# Patient Record
Sex: Male | Born: 1945 | Race: Black or African American | Hispanic: No | Marital: Married | Smoking: Former smoker
Health system: Southern US, Community
[De-identification: ages and names within clinical notes are randomized; demographics above are authoritative.]

## PROBLEM LIST (undated history)

## (undated) DIAGNOSIS — E785 Hyperlipidemia, unspecified: Secondary | ICD-10-CM

## (undated) DIAGNOSIS — I10 Essential (primary) hypertension: Secondary | ICD-10-CM

## (undated) HISTORY — DX: Essential (primary) hypertension: I10

## (undated) HISTORY — DX: Hyperlipidemia, unspecified: E78.5

---

## 1997-06-17 ENCOUNTER — Ambulatory Visit (HOSPITAL_COMMUNITY): Admission: RE | Admit: 1997-06-17 | Discharge: 1997-06-17 | Payer: Self-pay | Admitting: Internal Medicine

## 1997-10-26 ENCOUNTER — Ambulatory Visit (HOSPITAL_COMMUNITY): Admission: RE | Admit: 1997-10-26 | Discharge: 1997-10-26 | Payer: Self-pay | Admitting: Internal Medicine

## 1998-02-17 ENCOUNTER — Ambulatory Visit (HOSPITAL_COMMUNITY): Admission: RE | Admit: 1998-02-17 | Discharge: 1998-02-17 | Payer: Self-pay | Admitting: Internal Medicine

## 1999-11-01 ENCOUNTER — Encounter: Payer: Self-pay | Admitting: Internal Medicine

## 1999-11-01 ENCOUNTER — Ambulatory Visit (HOSPITAL_COMMUNITY): Admission: RE | Admit: 1999-11-01 | Discharge: 1999-11-01 | Payer: Self-pay | Admitting: Internal Medicine

## 2003-07-10 ENCOUNTER — Observation Stay (HOSPITAL_COMMUNITY): Admission: RE | Admit: 2003-07-10 | Discharge: 2003-07-11 | Payer: Self-pay | Admitting: Urology

## 2006-11-09 ENCOUNTER — Emergency Department (HOSPITAL_COMMUNITY): Admission: EM | Admit: 2006-11-09 | Discharge: 2006-11-09 | Payer: Self-pay | Admitting: Emergency Medicine

## 2007-10-12 ENCOUNTER — Encounter: Payer: Self-pay | Admitting: Internal Medicine

## 2007-10-12 ENCOUNTER — Ambulatory Visit: Payer: Self-pay | Admitting: Vascular Surgery

## 2007-10-12 ENCOUNTER — Ambulatory Visit (HOSPITAL_COMMUNITY): Admission: RE | Admit: 2007-10-12 | Discharge: 2007-10-12 | Payer: Self-pay | Admitting: Internal Medicine

## 2010-05-07 ENCOUNTER — Encounter: Payer: Self-pay | Admitting: Internal Medicine

## 2010-05-31 NOTE — Discharge Summary (Signed)
NAME:  Aaron Wells, BEER NO.:  [id number]   MEDICAL RECORD NO.:  [id number]                   PATIENT TYPE:  OBV   LOCATION:  0345                                 FACILITY:  [facility]   PHYSICIAN:  Lindaann Slough, M.D.               DATE OF BIRTH:  02/13/1944   DATE OF ADMISSION:  07/10/2003  DATE OF DISCHARGE:  07/11/2003                                 DISCHARGE SUMMARY   PROCEDURE DONE:  Removal and insertion of penile prosthesis on July 10, 2003.   HISTORY:  The patient is a 65 year old male who had a Dura 2 prosthesis  inserted in June 1995.  About a month ago, he noticed that the right  prosthesis was broken.  He was found, on physical examination, to have a  malfunction of the prosthesis, and he had a removal and replacement of the  prosthesis done on July 10, 2003, and he was admitted for observation after  the procedure.   PHYSICAL EXAMINATION:  VITAL SIGNS:  His blood pressure was 134/71, pulse  50, respirations 14, temperature 97.5.  HEENT:  Head is normal.  Pupils equal, round and reactive to light and  accommodation.  Ears, nose, and throat are within normal limits.  LUNGS:  Clear.  HEART:  Regular rhythm.  ABDOMEN:  Soft, nondistended, nontender.  No CVA tenderness.  No inguinal  hernia.  Bladder was not distended.  GU:  Penis and scrotal contents are within normal limits.  RECTAL:  Sphincter tone is normal.  Prostate is enlarged at 30 gm.  No  nodules.   LABORATORY DATA:  Hemoglobin on admission was 12.5 with hematocrit of 37.1  and WBC 3.4.  Sodium was 141, potassium 4.4, glucose 185, BUN 10, creatinine  1.3.  Urine is sterile.  Chest x-ray showed no evidence of active disease.  His EKG is normal.   Postoperatively, he did well.  He did not have any pain.  Foley catheter was  removed on the evening of the surgery, and after removing the Foley, he was  voiding well, his urine was clear.  The wound is clean and dry.  He was then  discharged home on July 11, 2003.   DISCHARGE MEDICATIONS:  1. Glucophage 1000 mg twice a day.  2. Actos 30 mg daily.  3. Percocet 5/325, 1-2 tablets q.4h. p.r.n. for pain.  4. Keflex 500 mg q.6h.  5. Lumigan one drop both eyes at bedtime.  6. Cosopt one drop both eyes twice a day.   DISCHARGE INSTRUCTIONS:  He was instructed not to do any lifting or any  straining or driving until further advised.   CONDITION ON DISCHARGE:  Improved.   DISCHARGE DIET:  Regular.   FOLLOW UP:  The patient will be followed in my office in 1 week.  Lindaann Slough, M.D.    MN/MEDQ  D:  07/11/2003  T:  07/11/2003  Job:  [id number]

## 2010-05-31 NOTE — Op Note (Signed)
NAME:  Aaron Wells, CASHER                          ACCOUNT NO.:  [id number]   MEDICAL RECORD NO.:  [id number]                   PATIENT TYPE:  AMB   LOCATION:  DAY                                  FACILITY:  [facility]   PHYSICIAN:  Lindaann Slough, M.D.               DATE OF BIRTH:  02/13/1944   DATE OF PROCEDURE:  07/10/2003  DATE OF DISCHARGE:                                 OPERATIVE REPORT   PREOPERATIVE DIAGNOSIS:  Malfunction of Dura-II prosthesis.   POSTOPERATIVE DIAGNOSIS:  Malfunction of Dura-II prosthesis.   PROCEDURE:  Removal of Dura-II prosthesis and replacement of Dura-II penile  prosthesis.   SURGEON:  Lindaann Slough, M.D.   ANESTHESIA:  General.   INDICATIONS:  Patient is a 65 year old male who had a Dura-II penile  prosthesis inserted in June, 1995.  He was doing well until about a month  ago when he noticed that the distal end of the prosthesis broke.  He was  seen in the office and was found to have a malfunction of the prosthesis.  In addition, he wanted to have the same prosthesis, and he is scheduled  today for the procedure.   Under general anesthesia, the patient was prepped and draped and placed in  the supine position.  The penis was scrubbed for about 10 minutes.  A  circumcision incision was made on the foreskin, and the incision was carried  down to the subcutaneous tissues.  The corpora were then identified, and a  corporectomy was done on the left side.  Two stay sutures were placed on  each side of the incision, then the prosthesis was removed.  The same  procedure was done on the right side, and the prosthesis was found to be  broken on the right side.  The distance between the pubis and mid glands was  13 cm.  The total length of the corpus was 21 cm.  The corpora were then  dilated without difficulty up to #14.  The corpora were then irrigated with  bulb juice.  Then a 5 cm distal tip extender and a 3 cm proximal tip  extender was placed on the  prosthesis, and then the prosthesis was inserted  into the left corpus was difficulty.  The same procedure was done on the  right side.  The wound was then irrigated with bulb juice, then the  corporotomies were closed with #2-0 Vicryl.  Then the penile incision was  closed in two layers with 4-0 chromic.  A #16 Foley catheter was inserted in  the bladder.  The urine was clear.   The patient tolerated the procedure well and was sent in satisfactory  condition to the post anesthesia care unit.  Lindaann Slough, M.D.    MN/MEDQ  D:  07/10/2003  T:  07/10/2003  Job:  [id number]

## 2010-07-02 ENCOUNTER — Other Ambulatory Visit: Payer: Self-pay | Admitting: Internal Medicine

## 2010-07-03 ENCOUNTER — Ambulatory Visit
Admission: RE | Admit: 2010-07-03 | Discharge: 2010-07-03 | Disposition: A | Discharge status: Home / Self Care | Payer: Medicare Other | Source: Ambulatory Visit | Attending: Internal Medicine | Admitting: Internal Medicine

## 2012-12-14 ENCOUNTER — Encounter (INDEPENDENT_AMBULATORY_CARE_PROVIDER_SITE_OTHER): Payer: Self-pay

## 2012-12-14 ENCOUNTER — Ambulatory Visit (HOSPITAL_COMMUNITY)
Admission: RE | Admit: 2012-12-14 | Discharge: 2012-12-14 | Disposition: A | Discharge status: Home / Self Care | Payer: Medicare Other | Source: Ambulatory Visit | Attending: Vascular Surgery | Admitting: Vascular Surgery

## 2012-12-14 ENCOUNTER — Other Ambulatory Visit (HOSPITAL_COMMUNITY): Payer: Self-pay | Admitting: Internal Medicine

## 2012-12-14 DIAGNOSIS — R252 Cramp and spasm: Secondary | ICD-10-CM

## 2013-01-14 ENCOUNTER — Other Ambulatory Visit: Payer: Self-pay | Admitting: Vascular Surgery

## 2013-01-14 DIAGNOSIS — I739 Peripheral vascular disease, unspecified: Secondary | ICD-10-CM

## 2013-01-24 ENCOUNTER — Encounter: Payer: Self-pay | Admitting: Vascular Surgery

## 2013-01-25 ENCOUNTER — Encounter (INDEPENDENT_AMBULATORY_CARE_PROVIDER_SITE_OTHER): Payer: Self-pay

## 2013-01-25 ENCOUNTER — Ambulatory Visit (INDEPENDENT_AMBULATORY_CARE_PROVIDER_SITE_OTHER): Payer: Medicare Other | Admitting: Vascular Surgery

## 2013-01-25 ENCOUNTER — Encounter: Payer: Self-pay | Admitting: Vascular Surgery

## 2013-01-25 ENCOUNTER — Ambulatory Visit (HOSPITAL_COMMUNITY)
Admission: RE | Admit: 2013-01-25 | Discharge: 2013-01-25 | Disposition: A | Discharge status: Home / Self Care | Payer: Medicare Other | Source: Ambulatory Visit | Attending: Vascular Surgery | Admitting: Vascular Surgery

## 2013-01-25 VITALS — BP 200/90 | HR 65 | Ht 73.0 in | Wt 185.0 lb

## 2013-01-25 DIAGNOSIS — I739 Peripheral vascular disease, unspecified: Secondary | ICD-10-CM | POA: Insufficient documentation

## 2013-01-25 DIAGNOSIS — I70219 Atherosclerosis of native arteries of extremities with intermittent claudication, unspecified extremity: Secondary | ICD-10-CM

## 2013-01-25 NOTE — Progress Notes (Signed)
Patient name: Aaron Wells MRN: [id number] DOB: Feb 26, 1945 Sex: male   Referred by: Dr. Marlou Sa  Reason for referral:  Chief Complaint  Patient presents with  . PVD    new pt, PVD    HISTORY OF PRESENT ILLNESS: Patient is a very nice 68 year old gentleman who presents with straightforward right calf claudication. He reports that his younger age she was an avid walker walking 7 miles a day. He stopped this and when he returned to walking noted tiredness and aching sensation in his right calf. He does work part-time and reports of this vessel difficulty with him when he is working. He was concern that he may be causing harm no tissue loss history. He has no cardiac or history of stroke. He does not have any left leg symptoms. Does have a history of hypertension hyperlipidemia and diabetes. Quit smoking in 1975  Past Medical History  Diagnosis Date  . Diabetes mellitus   . Hyperlipidemia   . Hypertension     History reviewed. No pertinent past surgical history.  History   Social History  . Marital Status: Married    Spouse Name: N/A    Number of Children: N/A  . Years of Education: N/A   Occupational History  . Not on file.   Social History Main Topics  . Smoking status: Former Smoker    Types: Cigarettes    Quit date: 01/25/1973  . Smokeless tobacco: Never Used  . Alcohol Use: No  . Drug Use: No  . Sexual Activity: Not on file   Other Topics Concern  . Not on file   Social History Narrative  . No narrative on file    Family History  Problem Relation Age of Onset  . Diabetes Mother   . Heart disease Mother   . Hyperlipidemia Mother   . Heart attack Mother   . Diabetes Father   . Heart disease Father   . Hyperlipidemia Father   . Heart attack Father   . Peripheral vascular disease Father     amputation  . Diabetes Sister   . Heart disease Sister   . Hyperlipidemia Sister   . Heart attack Sister   . Diabetes Brother     Allergies as of 01/25/2013    . (No Known Allergies)    Current Outpatient Prescriptions on File Prior to Visit  Medication Sig Dispense Refill  . CALCIUM CITRATE PO Take by mouth daily.        Marland Kitchen GLUCOSAMINE PO Take by mouth daily.        . Multiple Vitamins-Minerals (CENTRUM SILVER PO) Take by mouth daily.        . pioglitazone-metformin (ACTOPLUS MET) 15-850 MG per tablet Take 1 tablet by mouth 2 (two) times daily.        . Saxagliptin-Metformin (KOMBIGLYZE XR PO) Take 500 mg by mouth daily.         No current facility-administered medications on file prior to visit.     REVIEW OF SYSTEMS:  Positives indicated with an "X"  CARDIOVASCULAR:  [ ]  chest pain   [ ]  chest pressure   [ ]  palpitations   [ ]  orthopnea   [ ]  dyspnea on exertion   [x ] claudication   [ ]  rest pain   [ ]  DVT   [ ]  phlebitis PULMONARY:   [ ]  productive cough   [ ]  asthma   [ ]  wheezing NEUROLOGIC:   [ ]  weakness  [ ]   paresthesias  [ ]  aphasia  [ ]  amaurosis  [ ]  dizziness HEMATOLOGIC:   [ ]  bleeding problems   [ ]  clotting disorders MUSCULOSKELETAL:  [ ]  joint pain   [ ]  joint swelling GASTROINTESTINAL: [ ]   blood in stool  [ ]   hematemesis GENITOURINARY:  [ ]   dysuria  [ ]   hematuria PSYCHIATRIC:  [ ]  history of major depression INTEGUMENTARY:  [x ] rashes  [ ]  ulcers CONSTITUTIONAL:  [ ]  fever   [ ]  chills  PHYSICAL EXAMINATION:  General: The patient is a well-nourished male, in no acute distress. Vital signs are BP 200/90  Pulse 65  Ht 6' 1"  (1.854 m)  Wt 185 lb (83.915 kg)  BMI 24.41 kg/m2  SpO2 100% Pulmonary: There is a good air exchange bilaterally without wheezing or rales. Abdomen: Soft and non-tender with normal pitch bowel sounds. No aneurysm palpable Musculoskeletal: There are no major deformities.  There is no significant extremity pain. Neurologic: No focal weakness or paresthesias are detected, Skin: There are no ulcer or rashes noted. Psychiatric: The patient has normal affect. Cardiovascular: There is a  regular rate and rhythm without significant murmur appreciated. Carotid arteries without bruits bilaterally Pulse status 2+ radial 2+ femoral 2+ popliteal pulses bilaterally. Absent right dorsalis pedis and posterior tibial pulse, 2+ left posterior tibial pulse  [facility] Vascular Lab Studies:  Ordered and Independently Reviewed revealed an ankle arm index of 0.63 on the right and normal on the left.  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 Had a long discussion with the patient explaining that this is not limb threatening. Also explained that walking program was important for him to continue his walking ability. I did explain her next evaluation would be arteriography. I explained that the popliteal location be unlikely that he would have any lesion that would be treated with endovascular means with angioplasty or stenting. Did explain procedure of bypass around the obstruction with saphenous vein bypass. I explained we would reserve this for severely limiting claudication or rest pain. Ex-preemie at unlikely that would progress to this. Did discuss the potential use of Pletal but explained that this had improvement in the small percentage of patients and he does not want to be placed on additional medication. He will continue to monitor this notify should he develop progressive symptoms. Otherwise he will continue his walking program and see [place] on an as-needed basis    [name] [facility] [place] Office: [phone]

## 2014-07-10 ENCOUNTER — Other Ambulatory Visit: Payer: Self-pay

## 2014-09-12 ENCOUNTER — Other Ambulatory Visit: Payer: Self-pay | Admitting: Internal Medicine

## 2014-09-12 ENCOUNTER — Ambulatory Visit
Admission: RE | Admit: 2014-09-12 | Discharge: 2014-09-12 | Disposition: A | Discharge status: Home / Self Care | Payer: PRIVATE HEALTH INSURANCE | Source: Ambulatory Visit | Attending: Internal Medicine | Admitting: Internal Medicine

## 2014-09-12 DIAGNOSIS — T65221A Toxic effect of tobacco cigarettes, accidental (unintentional), initial encounter: Secondary | ICD-10-CM

## 2015-08-07 ENCOUNTER — Encounter (HOSPITAL_COMMUNITY): Payer: PRIVATE HEALTH INSURANCE

## 2015-08-20 ENCOUNTER — Other Ambulatory Visit (HOSPITAL_COMMUNITY): Payer: Self-pay | Admitting: Internal Medicine

## 2015-08-20 ENCOUNTER — Ambulatory Visit (HOSPITAL_COMMUNITY)
Admission: RE | Admit: 2015-08-20 | Discharge: 2015-08-20 | Disposition: A | Discharge status: Home / Self Care | Payer: Medicare Other | Source: Ambulatory Visit | Attending: Internal Medicine | Admitting: Internal Medicine

## 2015-08-20 DIAGNOSIS — E119 Type 2 diabetes mellitus without complications: Secondary | ICD-10-CM | POA: Diagnosis not present

## 2015-08-20 DIAGNOSIS — I739 Peripheral vascular disease, unspecified: Secondary | ICD-10-CM

## 2015-08-20 DIAGNOSIS — I10 Essential (primary) hypertension: Secondary | ICD-10-CM | POA: Diagnosis not present

## 2015-08-20 DIAGNOSIS — R938 Abnormal findings on diagnostic imaging of other specified body structures: Secondary | ICD-10-CM | POA: Diagnosis not present

## 2015-08-20 DIAGNOSIS — E785 Hyperlipidemia, unspecified: Secondary | ICD-10-CM | POA: Insufficient documentation

## 2018-07-05 MED FILL — SPS 15 GM/60 ML SUSPENSION: 15 | 1 days supply | Qty: 200 | Fill #0

## 2018-07-22 ENCOUNTER — Other Ambulatory Visit: Payer: Self-pay | Admitting: Nephrology

## 2018-07-22 DIAGNOSIS — N183 Chronic kidney disease, stage 3 unspecified: Secondary | ICD-10-CM

## 2018-07-27 ENCOUNTER — Other Ambulatory Visit: Payer: Self-pay

## 2018-07-27 ENCOUNTER — Ambulatory Visit
Admission: RE | Admit: 2018-07-27 | Discharge: 2018-07-27 | Disposition: A | Discharge status: Home / Self Care | Payer: Medicare Other | Source: Ambulatory Visit | Attending: Nephrology | Admitting: Nephrology

## 2018-07-27 DIAGNOSIS — N183 Chronic kidney disease, stage 3 unspecified: Secondary | ICD-10-CM

## 2018-11-19 ENCOUNTER — Other Ambulatory Visit: Payer: Self-pay | Admitting: Orthopedic Surgery

## 2018-11-19 DIAGNOSIS — M7502 Adhesive capsulitis of left shoulder: Secondary | ICD-10-CM

## 2018-11-19 DIAGNOSIS — M75102 Unspecified rotator cuff tear or rupture of left shoulder, not specified as traumatic: Secondary | ICD-10-CM

## 2018-12-01 ENCOUNTER — Other Ambulatory Visit: Payer: Self-pay

## 2018-12-01 ENCOUNTER — Ambulatory Visit
Admission: RE | Admit: 2018-12-01 | Discharge: 2018-12-01 | Disposition: A | Discharge status: Home / Self Care | Payer: Medicare Other | Source: Ambulatory Visit | Attending: Orthopedic Surgery | Admitting: Orthopedic Surgery

## 2018-12-01 DIAGNOSIS — M7502 Adhesive capsulitis of left shoulder: Secondary | ICD-10-CM

## 2018-12-01 DIAGNOSIS — M75102 Unspecified rotator cuff tear or rupture of left shoulder, not specified as traumatic: Secondary | ICD-10-CM

## 2018-12-01 MED ORDER — IOPAMIDOL (ISOVUE-M 200) INJECTION 41%
12.0000 mL | Freq: Once | INTRAMUSCULAR | Status: AC
  Administered 2018-12-01: 17:00:00 12 mL via INTRA_ARTICULAR

## 2019-03-06 ENCOUNTER — Ambulatory Visit: Payer: Self-pay | Attending: Internal Medicine

## 2019-03-06 DIAGNOSIS — Z23 Encounter for immunization: Secondary | ICD-10-CM | POA: Insufficient documentation

## 2019-03-06 NOTE — Progress Notes (Signed)
   Covid-19 Vaccination Clinic  Name:  Vishnu Moeller    MRN: [id number] DOB: [birth year]  03/06/2019  Mr. Berka was observed post Covid-19 immunization for 15 minutes without incidence. He was provided with Vaccine Information Sheet and instruction to access the V-Safe system.   Mr. Gappa was instructed to call 911 with any severe reactions post vaccine: Marland Kitchen Difficulty breathing  . Swelling of your face and throat  . A fast heartbeat  . A bad rash all over your body  . Dizziness and weakness    Immunizations Administered    Name Date Dose VIS Date Route   Pfizer COVID-19 Vaccine 03/06/2019  9:42 AM 0.3 mL 12/24/2018 Intramuscular   Manufacturer: [employer], [employer]   Lot: [id number]   NDC: [address]-2

## 2019-03-30 ENCOUNTER — Ambulatory Visit: Payer: Medicare PPO | Attending: Internal Medicine

## 2019-03-30 DIAGNOSIS — Z23 Encounter for immunization: Secondary | ICD-10-CM

## 2019-03-30 NOTE — Progress Notes (Signed)
   Covid-19 Vaccination Clinic  Name:  Aaron Wells    MRN: [id number] DOB: 12/07/1945  03/30/2019  [name] was observed post Covid-19 immunization for 15 minutes without incident. He was provided with Vaccine Information Sheet and instruction to access the V-Safe system.   [name] was instructed to call 911 with any severe reactions post vaccine: Marland Kitchen Difficulty breathing  . Swelling of face and throat  . A fast heartbeat  . A bad rash all over body  . Dizziness and weakness   Immunizations Administered    Name Date Dose VIS Date Route   Pfizer COVID-19 Vaccine 03/30/2019  9:23 AM 0.3 mL 12/24/2018 Intramuscular   Manufacturer: [employer], [employer]   Lot: [id number]   NDC: [address]-2

## 2020-04-29 IMAGING — XA DG FLUORO GUIDE NDL PLC/BX
1 series · 1 of 1 positions shown · non-contrast
Comparison: none

CLINICAL DATA: Left shoulder pain.

[Series 1: ortho standard · 1 of 1 slices shown]
[im 1/1]
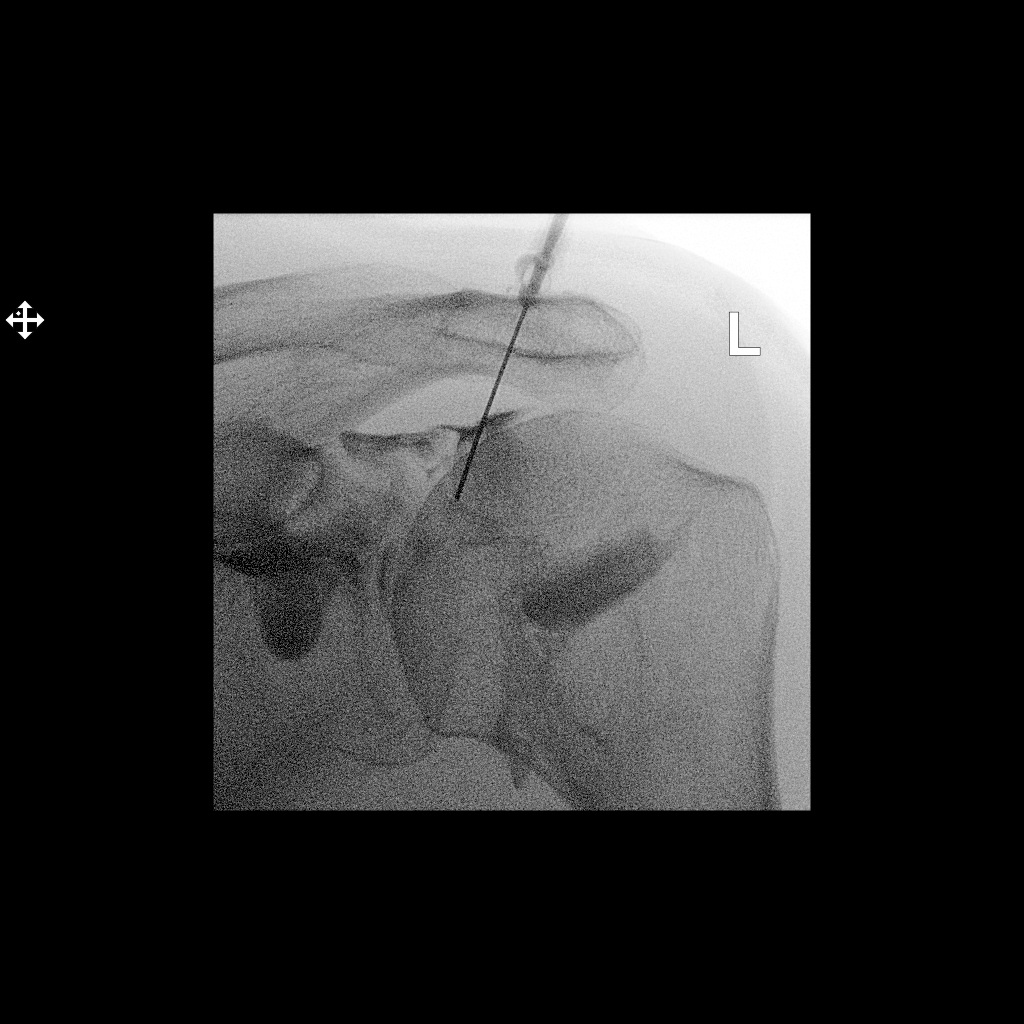

[1 of 1 positions shown; findings below may reference images not displayed]

FLUOROSCOPY TIME:  Radiation Exposure Index (as provided by the
fluoroscopic device): 0.1 mGy

Fluoroscopy Time:  Less than 1 second

Number of Acquired Images:  0

PROCEDURE:
The risks and benefits of the procedure were discussed with the
patient, and written informed consent was obtained. The patient
stated no history of allergy to contrast media. A formal timeout
procedure was performed with the patient according to departmental
protocol.

The patient was placed supine on the fluoroscopy table and the left
glenohumeral joint was identified under fluoroscopy. The skin
overlying the left glenohumeral joint was subsequently cleaned with
Betadine and a sterile drape was placed over the area of interest. 2
ml 1% Lidocaine was used to anesthetize the skin around the needle
insertion site.

A 22 gauge spinal needle was inserted into the left glenohumeral
joint under fluoroscopy.

12 ml of contrast mixture (5 ml of sterile saline and 15 ml of
Isovue-M 200 contrast) were injected into the left glenohumeral
joint.

The needle was removed and hemostasis was achieved. The patient was
subsequently transferred to CT for imaging.
IMPRESSION: Technically successful left shoulder injection for CT.

## 2022-01-01 ENCOUNTER — Ambulatory Visit (HOSPITAL_COMMUNITY)
Admission: RE | Admit: 2022-01-01 | Discharge: 2022-01-01 | Disposition: A | Discharge status: Home / Self Care | Payer: Medicare PPO | Source: Ambulatory Visit | Attending: Vascular Surgery | Admitting: Vascular Surgery

## 2022-01-01 ENCOUNTER — Other Ambulatory Visit (HOSPITAL_COMMUNITY): Payer: Self-pay | Admitting: Internal Medicine

## 2022-01-01 ENCOUNTER — Ambulatory Visit (INDEPENDENT_AMBULATORY_CARE_PROVIDER_SITE_OTHER)
Admission: RE | Admit: 2022-01-01 | Discharge: 2022-01-01 | Disposition: A | Discharge status: Home / Self Care | Payer: Medicare PPO | Source: Ambulatory Visit | Attending: Vascular Surgery | Admitting: Vascular Surgery

## 2022-01-01 DIAGNOSIS — I70211 Atherosclerosis of native arteries of extremities with intermittent claudication, right leg: Secondary | ICD-10-CM

## 2022-01-01 DIAGNOSIS — I70219 Atherosclerosis of native arteries of extremities with intermittent claudication, unspecified extremity: Secondary | ICD-10-CM

## 2022-05-15 ENCOUNTER — Other Ambulatory Visit: Payer: Self-pay | Admitting: *Deleted

## 2022-05-15 DIAGNOSIS — M79606 Pain in leg, unspecified: Secondary | ICD-10-CM

## 2022-05-16 ENCOUNTER — Ambulatory Visit (HOSPITAL_COMMUNITY)
Admission: RE | Admit: 2022-05-16 | Discharge: 2022-05-16 | Disposition: A | Discharge status: Home / Self Care | Payer: Medicare PPO | Source: Ambulatory Visit | Attending: Vascular Surgery | Admitting: Vascular Surgery

## 2022-05-16 DIAGNOSIS — M79606 Pain in leg, unspecified: Secondary | ICD-10-CM | POA: Diagnosis present

## 2022-05-16 LAB — VAS US ABI WITH/WO TBI
Left ABI: 0.87
Right ABI: 0.53

## 2022-05-23 ENCOUNTER — Ambulatory Visit: Payer: Medicare PPO | Admitting: Vascular Surgery

## 2022-05-23 ENCOUNTER — Encounter: Payer: Self-pay | Admitting: Vascular Surgery

## 2022-05-23 VITALS — BP 145/85 | HR 79 | Temp 98.0°F | Resp 20 | Ht 73.0 in | Wt 159.0 lb

## 2022-05-23 DIAGNOSIS — I70213 Atherosclerosis of native arteries of extremities with intermittent claudication, bilateral legs: Secondary | ICD-10-CM

## 2022-05-23 NOTE — Progress Notes (Signed)
Office Note     CC: Bilateral lower extremity claudication Requesting Provider:  Gwenyth Bender, MD  HPI: Coehn Banner is a 77 y.o. (Feb 22, 1945) male presenting at the request of .Gwenyth Bender, MD for evaluation of bilateral lower extremity claudication.  On exam, Purvis was doing well.  A native of [place], he is a [employer] high school.  He worked for years for the state, and is now retired, Retail banker pension.  He also worked part-time at [employer].  He has multiple children and grandchildren, whom he is very proud of.  His spouse could not attend his appointment today.    While his notes bilateral lower extremity pain in his calves when ambulating.  This occurs after walking a couple 100 yards, with symptoms occurring more in the right leg as opposed to the left.  He can grocery shop without issue. It has been present for several months, but caused him to worry as the more he walks, the more the pain occurs.  He denies ischemic rest pain, tissue loss.  No prior history of vascular or venous intervention.  Longstanding history of diabetes mellitus. Former smoker   Past Medical History:  Diagnosis Date   Diabetes mellitus    Hyperlipidemia    Hypertension     History reviewed. No pertinent surgical history.  Social History   Socioeconomic History   Marital status: Married    Spouse name: Not on file   Number of children: Not on file   Years of education: Not on file   Highest education level: Not on file  Occupational History   Not on file  Tobacco Use   Smoking status: Former    Types: Cigarettes    Quit date: 01/25/1973    Years since quitting: 49.3   Smokeless tobacco: Never  Substance and Sexual Activity   Alcohol use: No   Drug use: No   Sexual activity: Not on file  Other Topics Concern   Not on file  Social History Narrative   Not on file   Social Determinants of Health   Financial Resource Strain: Not on file  Food Insecurity: Not on file   Transportation Needs: Not on file  Physical Activity: Not on file  Stress: Not on file  Social Connections: Not on file  Intimate Partner Violence: Not on file   Family History  Problem Relation Age of Onset   Diabetes Mother    Heart disease Mother    Hyperlipidemia Mother    Heart attack Mother    Diabetes Father    Heart disease Father    Hyperlipidemia Father    Heart attack Father    Peripheral vascular disease Father        amputation   Diabetes Sister    Heart disease Sister    Hyperlipidemia Sister    Heart attack Sister    Diabetes Brother     Current Outpatient Medications  Medication Sig Dispense Refill   amLODipine (NORVASC) 5 MG tablet Take 5 mg by mouth daily.     aspirin EC 81 MG tablet Take 81 mg by mouth daily.     CALCIUM CITRATE PO Take by mouth daily.       dorzolamide-timolol (COSOPT) 2-0.5 % ophthalmic solution 1 drop 2 (two) times daily.     furosemide (LASIX) 20 MG tablet Take 20 mg by mouth daily.     glipiZIDE (GLUCOTROL) 5 MG tablet Take 5 mg by mouth daily.  GLUCOSAMINE PO Take by mouth daily.       hydrOXYzine (ATARAX) 10 MG tablet Take 10 mg by mouth at bedtime as needed.     JANUVIA 100 MG tablet Take by mouth.     latanoprost (XALATAN) 0.005 % ophthalmic solution 1 drop nightly.     Multiple Vitamins-Minerals (CENTRUM SILVER PO) Take by mouth daily.       omeprazole (PRILOSEC) 40 MG capsule 1 cap(s) orally once a day     pioglitazone-metformin (ACTOPLUS MET) 15-850 MG per tablet Take 1 tablet by mouth 2 (two) times daily.       rosuvastatin (CRESTOR) 5 MG tablet Take by mouth.     Saxagliptin-Metformin (KOMBIGLYZE XR PO) Take 500 mg by mouth daily.       Vitamin D, Ergocalciferol, (DRISDOL) 1.25 MG (50000 UNIT) CAPS capsule Take 50,000 Units by mouth once a week.     No current facility-administered medications for this visit.    No Known Allergies   REVIEW OF SYSTEMS:  [X]  denotes positive finding, [ ]  denotes negative  finding Cardiac  Comments:  Chest pain or chest pressure:    Shortness of breath upon exertion:    Short of breath when lying flat:    Irregular heart rhythm:        Vascular    Pain in calf, thigh, or hip brought on by ambulation:    Pain in feet at night that wakes you up from your sleep:     Blood clot in your veins:    Leg swelling:         Pulmonary    Oxygen at home:    Productive cough:     Wheezing:         Neurologic    Sudden weakness in arms or legs:     Sudden numbness in arms or legs:     Sudden onset of difficulty speaking or slurred speech:    Temporary loss of vision in one eye:     Problems with dizziness:         Gastrointestinal    Blood in stool:     Vomited blood:         Genitourinary    Burning when urinating:     Blood in urine:        Psychiatric    Major depression:         Hematologic    Bleeding problems:    Problems with blood clotting too easily:        Skin    Rashes or ulcers:        Constitutional    Fever or chills:      PHYSICAL EXAMINATION:  Vitals:   05/23/22 1527  BP: (!) 145/85  Pulse: 79  Resp: 20  Temp: 98 F (36.7 C)  SpO2: 97%  Weight: 159 lb (72.1 kg)  Height: 6\' 1"  (1.854 m)    General:  WDWN in NAD; vital signs documented above Gait: Not observed HENT: WNL, normocephalic Pulmonary: normal non-labored breathing , without wheezing Cardiac: regular HR Abdomen: soft, NT, no masses Skin: without rashes Vascular Exam/Pulses:  Right Left  Radial 2+ (normal) 2+ (normal)  Ulnar    Femoral    Popliteal    DP absent 2+ (normal)  PT     Extremities: without ischemic changes, without Gangrene , without cellulitis; without open wounds;  Musculoskeletal: no muscle wasting or atrophy  Neurologic: A&O X 3;  No focal weakness or paresthesias  are detected Psychiatric:  The pt has Normal affect.   Non-Invasive Vascular Imaging:   ABI Findings:  +---------+------------------+-----+----------+--------+   Right   Rt Pressure (mmHg)IndexWaveform  Comment   +---------+------------------+-----+----------+--------+  Brachial 160                                        +---------+------------------+-----+----------+--------+  PTA     87                0.53 monophasic          +---------+------------------+-----+----------+--------+  DP      82                0.50 monophasic          +---------+------------------+-----+----------+--------+  Great Toe67                0.41                     +---------+------------------+-----+----------+--------+   +---------+------------------+-----+----------+-------+  Left    Lt Pressure (mmHg)IndexWaveform  Comment  +---------+------------------+-----+----------+-------+  Brachial 165                                       +---------+------------------+-----+----------+-------+  PTA     143               0.87 biphasic           +---------+------------------+-----+----------+-------+  DP      139               0.84 monophasic         +---------+------------------+-----+----------+-------+  Great Toe97                0.59                    +---------+------------------+-----+----------+-------+   +-------+-----------+-----------+------------+------------+  ABI/TBIToday's ABIToday's TBIPrevious ABIPrevious TBI  +-------+-----------+-----------+------------+------------+  Right 0.53       0.41       0.54        0.37          +-------+-----------+-----------+------------+------------+  Left  0.87       0.59       0.93        0.62          +-------+-----------+-----------+------------+------------+      ASSESSMENT/PLAN: Queshawn Simcik is a 77 y.o. male presenting with bilateral lower extremity claudication, right greater than left.  On physical exam, patient had a nonpalpable pulse in the right foot, palpable pulse on the left.  ABIs demonstrate moderate disease in the right leg,  mild disease in the left.  While send I had a long discussion regarding the above.  We discussed that the longer he can forego intervention, the less risky will assume long-term.  With his current level of claudication, I challenged him to continue walking on a daily basis.  My plan is to see him in 6 months.  Should this worsen, we discussed trying a medication-first approach, Pletal.  I asked that he continue keeping a very close eye on his feet as he has some baseline neuropathy.  We discussed that longstanding diabetes causes microvascular disease which is not amenable to any endovascular intervention.  Recommend the following which can slow the progression of atherosclerosis and  reduce the risk of major adverse cardiac / limb events:  Aspirin 81mg  PO QD.  Atorvastatin 40-80mg  PO QD (or other "high intensity" statin therapy). Complete cessation from all tobacco products. Blood glucose control with goal A1c < 7%. Blood pressure control with goal blood pressure < 140/90 mmHg. Lipid reduction therapy with goal LDL-C <100 mg/dL (<40 if symptomatic from PAD).     Victorino Sparrow, MD [facility] [phone]

## 2022-06-03 ENCOUNTER — Other Ambulatory Visit: Payer: Self-pay

## 2022-06-03 DIAGNOSIS — I70213 Atherosclerosis of native arteries of extremities with intermittent claudication, bilateral legs: Secondary | ICD-10-CM
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
## 2022-11-27 ENCOUNTER — Encounter: Payer: Self-pay | Admitting: Vascular Surgery

## 2022-11-27 ENCOUNTER — Ambulatory Visit (HOSPITAL_COMMUNITY)
Admission: RE | Admit: 2022-11-27 | Discharge: 2022-11-27 | Disposition: A | Discharge status: Home / Self Care | Payer: Medicare PPO | Source: Ambulatory Visit | Attending: Vascular Surgery | Admitting: Vascular Surgery

## 2022-11-27 ENCOUNTER — Ambulatory Visit: Payer: Medicare PPO | Admitting: Vascular Surgery

## 2022-11-27 VITALS — BP 146/74 | HR 67 | Temp 97.8°F | Resp 20 | Ht 73.0 in | Wt 159.0 lb

## 2022-11-27 DIAGNOSIS — I70213 Atherosclerosis of native arteries of extremities with intermittent claudication, bilateral legs: Secondary | ICD-10-CM

## 2022-11-27 LAB — VAS US ABI WITH/WO TBI
Left ABI: 0.97
Right ABI: 0.59

## 2022-11-28 ENCOUNTER — Ambulatory Visit: Payer: Medicare PPO | Admitting: Vascular Surgery

## 2022-11-28 ENCOUNTER — Encounter (HOSPITAL_COMMUNITY): Payer: Medicare PPO

## 2022-12-05 ENCOUNTER — Other Ambulatory Visit: Payer: Self-pay

## 2022-12-05 DIAGNOSIS — I70213 Atherosclerosis of native arteries of extremities with intermittent claudication, bilateral legs: Secondary | ICD-10-CM

## 2023-02-06 DIAGNOSIS — H524 Presbyopia: Secondary | ICD-10-CM | POA: Diagnosis not present

## 2023-02-06 DIAGNOSIS — H401133 Primary open-angle glaucoma, bilateral, severe stage: Secondary | ICD-10-CM | POA: Diagnosis not present

## 2023-03-09 DIAGNOSIS — E1122 Type 2 diabetes mellitus with diabetic chronic kidney disease: Secondary | ICD-10-CM | POA: Diagnosis not present

## 2023-03-09 DIAGNOSIS — M65342 Trigger finger, left ring finger: Secondary | ICD-10-CM | POA: Diagnosis not present

## 2023-03-09 DIAGNOSIS — I129 Hypertensive chronic kidney disease with stage 1 through stage 4 chronic kidney disease, or unspecified chronic kidney disease: Secondary | ICD-10-CM | POA: Diagnosis not present

## 2023-03-09 DIAGNOSIS — N183 Chronic kidney disease, stage 3 unspecified: Secondary | ICD-10-CM | POA: Diagnosis not present

## 2023-03-09 DIAGNOSIS — E559 Vitamin D deficiency, unspecified: Secondary | ICD-10-CM | POA: Diagnosis not present

## 2023-03-09 DIAGNOSIS — E785 Hyperlipidemia, unspecified: Secondary | ICD-10-CM | POA: Diagnosis not present

## 2023-03-24 DIAGNOSIS — M65342 Trigger finger, left ring finger: Secondary | ICD-10-CM | POA: Diagnosis not present

## 2023-04-24 DIAGNOSIS — E782 Mixed hyperlipidemia: Secondary | ICD-10-CM | POA: Diagnosis not present

## 2023-04-24 DIAGNOSIS — N1832 Chronic kidney disease, stage 3b: Secondary | ICD-10-CM | POA: Diagnosis not present

## 2023-04-24 DIAGNOSIS — I119 Hypertensive heart disease without heart failure: Secondary | ICD-10-CM | POA: Diagnosis not present

## 2023-04-24 DIAGNOSIS — F5101 Primary insomnia: Secondary | ICD-10-CM | POA: Diagnosis not present

## 2023-04-24 DIAGNOSIS — E1122 Type 2 diabetes mellitus with diabetic chronic kidney disease: Secondary | ICD-10-CM | POA: Diagnosis not present

## 2023-04-24 DIAGNOSIS — I739 Peripheral vascular disease, unspecified: Secondary | ICD-10-CM | POA: Diagnosis not present

## 2023-04-24 DIAGNOSIS — E559 Vitamin D deficiency, unspecified: Secondary | ICD-10-CM | POA: Diagnosis not present

## 2023-04-24 DIAGNOSIS — F4320 Adjustment disorder, unspecified: Secondary | ICD-10-CM | POA: Diagnosis not present

## 2023-05-06 DIAGNOSIS — E1122 Type 2 diabetes mellitus with diabetic chronic kidney disease: Secondary | ICD-10-CM | POA: Diagnosis not present

## 2023-05-06 DIAGNOSIS — N1832 Chronic kidney disease, stage 3b: Secondary | ICD-10-CM | POA: Diagnosis not present

## 2023-05-06 DIAGNOSIS — I119 Hypertensive heart disease without heart failure: Secondary | ICD-10-CM | POA: Diagnosis not present

## 2023-05-06 DIAGNOSIS — E782 Mixed hyperlipidemia: Secondary | ICD-10-CM | POA: Diagnosis not present

## 2023-06-10 DIAGNOSIS — H401133 Primary open-angle glaucoma, bilateral, severe stage: Secondary | ICD-10-CM | POA: Diagnosis not present

## 2023-06-30 DIAGNOSIS — F5101 Primary insomnia: Secondary | ICD-10-CM | POA: Diagnosis not present

## 2023-06-30 DIAGNOSIS — E782 Mixed hyperlipidemia: Secondary | ICD-10-CM | POA: Diagnosis not present

## 2023-06-30 DIAGNOSIS — E559 Vitamin D deficiency, unspecified: Secondary | ICD-10-CM | POA: Diagnosis not present

## 2023-06-30 DIAGNOSIS — F4320 Adjustment disorder, unspecified: Secondary | ICD-10-CM | POA: Diagnosis not present

## 2023-06-30 DIAGNOSIS — E1122 Type 2 diabetes mellitus with diabetic chronic kidney disease: Secondary | ICD-10-CM | POA: Diagnosis not present

## 2023-06-30 DIAGNOSIS — I739 Peripheral vascular disease, unspecified: Secondary | ICD-10-CM | POA: Diagnosis not present

## 2023-06-30 DIAGNOSIS — I119 Hypertensive heart disease without heart failure: Secondary | ICD-10-CM | POA: Diagnosis not present

## 2023-06-30 DIAGNOSIS — K029 Dental caries, unspecified: Secondary | ICD-10-CM | POA: Diagnosis not present

## 2023-06-30 DIAGNOSIS — N1832 Chronic kidney disease, stage 3b: Secondary | ICD-10-CM | POA: Diagnosis not present

## 2023-07-28 DIAGNOSIS — E559 Vitamin D deficiency, unspecified: Secondary | ICD-10-CM | POA: Diagnosis not present

## 2023-07-28 DIAGNOSIS — I129 Hypertensive chronic kidney disease with stage 1 through stage 4 chronic kidney disease, or unspecified chronic kidney disease: Secondary | ICD-10-CM | POA: Diagnosis not present

## 2023-07-28 DIAGNOSIS — E785 Hyperlipidemia, unspecified: Secondary | ICD-10-CM | POA: Diagnosis not present

## 2023-07-28 DIAGNOSIS — N183 Chronic kidney disease, stage 3 unspecified: Secondary | ICD-10-CM | POA: Diagnosis not present

## 2023-07-28 DIAGNOSIS — E1122 Type 2 diabetes mellitus with diabetic chronic kidney disease: Secondary | ICD-10-CM | POA: Diagnosis not present

## 2023-08-26 DIAGNOSIS — R8290 Unspecified abnormal findings in urine: Secondary | ICD-10-CM | POA: Diagnosis not present

## 2023-08-26 DIAGNOSIS — N1832 Chronic kidney disease, stage 3b: Secondary | ICD-10-CM | POA: Diagnosis not present

## 2023-08-26 DIAGNOSIS — E782 Mixed hyperlipidemia: Secondary | ICD-10-CM | POA: Diagnosis not present

## 2023-08-26 DIAGNOSIS — I119 Hypertensive heart disease without heart failure: Secondary | ICD-10-CM | POA: Diagnosis not present

## 2023-08-26 DIAGNOSIS — E1122 Type 2 diabetes mellitus with diabetic chronic kidney disease: Secondary | ICD-10-CM | POA: Diagnosis not present

## 2023-10-01 DIAGNOSIS — F5101 Primary insomnia: Secondary | ICD-10-CM | POA: Diagnosis not present

## 2023-10-01 DIAGNOSIS — K5901 Slow transit constipation: Secondary | ICD-10-CM | POA: Diagnosis not present

## 2023-10-01 DIAGNOSIS — F4321 Adjustment disorder with depressed mood: Secondary | ICD-10-CM | POA: Diagnosis not present

## 2023-10-01 DIAGNOSIS — N1832 Chronic kidney disease, stage 3b: Secondary | ICD-10-CM | POA: Diagnosis not present

## 2023-10-01 DIAGNOSIS — I739 Peripheral vascular disease, unspecified: Secondary | ICD-10-CM | POA: Diagnosis not present

## 2023-10-01 DIAGNOSIS — E782 Mixed hyperlipidemia: Secondary | ICD-10-CM | POA: Diagnosis not present

## 2023-10-01 DIAGNOSIS — E1122 Type 2 diabetes mellitus with diabetic chronic kidney disease: Secondary | ICD-10-CM | POA: Diagnosis not present

## 2023-10-01 DIAGNOSIS — E559 Vitamin D deficiency, unspecified: Secondary | ICD-10-CM | POA: Diagnosis not present

## 2023-10-01 DIAGNOSIS — Z23 Encounter for immunization: Secondary | ICD-10-CM | POA: Diagnosis not present

## 2023-10-01 DIAGNOSIS — I119 Hypertensive heart disease without heart failure: Secondary | ICD-10-CM | POA: Diagnosis not present

## 2023-10-05 ENCOUNTER — Other Ambulatory Visit: Payer: Self-pay | Admitting: Internal Medicine

## 2023-10-05 ENCOUNTER — Ambulatory Visit
Admission: RE | Admit: 2023-10-05 | Discharge: 2023-10-05 | Disposition: A | Discharge status: Home / Self Care | Source: Ambulatory Visit | Attending: Internal Medicine | Admitting: Internal Medicine

## 2023-10-05 DIAGNOSIS — R1012 Left upper quadrant pain: Secondary | ICD-10-CM

## 2023-10-05 DIAGNOSIS — R140 Abdominal distension (gaseous): Secondary | ICD-10-CM | POA: Diagnosis not present

## 2023-10-30 DIAGNOSIS — H524 Presbyopia: Secondary | ICD-10-CM | POA: Diagnosis not present

## 2023-10-30 DIAGNOSIS — H401133 Primary open-angle glaucoma, bilateral, severe stage: Secondary | ICD-10-CM | POA: Diagnosis not present

## 2023-10-30 DIAGNOSIS — Z794 Long term (current) use of insulin: Secondary | ICD-10-CM | POA: Diagnosis not present

## 2023-10-30 DIAGNOSIS — E119 Type 2 diabetes mellitus without complications: Secondary | ICD-10-CM | POA: Diagnosis not present

## 2023-11-24 DIAGNOSIS — I129 Hypertensive chronic kidney disease with stage 1 through stage 4 chronic kidney disease, or unspecified chronic kidney disease: Secondary | ICD-10-CM | POA: Diagnosis not present

## 2023-11-24 DIAGNOSIS — D509 Iron deficiency anemia, unspecified: Secondary | ICD-10-CM | POA: Diagnosis not present

## 2023-11-24 DIAGNOSIS — E1122 Type 2 diabetes mellitus with diabetic chronic kidney disease: Secondary | ICD-10-CM | POA: Diagnosis not present

## 2023-11-24 DIAGNOSIS — E559 Vitamin D deficiency, unspecified: Secondary | ICD-10-CM | POA: Diagnosis not present

## 2023-11-24 DIAGNOSIS — E785 Hyperlipidemia, unspecified: Secondary | ICD-10-CM | POA: Diagnosis not present

## 2023-11-24 DIAGNOSIS — N183 Chronic kidney disease, stage 3 unspecified: Secondary | ICD-10-CM | POA: Diagnosis not present

## 2023-11-30 ENCOUNTER — Other Ambulatory Visit: Payer: Self-pay | Admitting: Vascular Surgery

## 2023-11-30 DIAGNOSIS — I70213 Atherosclerosis of native arteries of extremities with intermittent claudication, bilateral legs: Secondary | ICD-10-CM

## 2023-12-02 DIAGNOSIS — R350 Frequency of micturition: Secondary | ICD-10-CM | POA: Diagnosis not present

## 2023-12-02 DIAGNOSIS — N401 Enlarged prostate with lower urinary tract symptoms: Secondary | ICD-10-CM | POA: Diagnosis not present
